# Patient Record
Sex: Female | Born: 1975 | Race: Black or African American | Hispanic: No | Smoking: Never smoker
Health system: Southern US, Community
[De-identification: ages and names within clinical notes are randomized; demographics above are authoritative.]

## PROBLEM LIST (undated history)

## (undated) DIAGNOSIS — R51 Headache: Secondary | ICD-10-CM

## (undated) DIAGNOSIS — D649 Anemia, unspecified: Secondary | ICD-10-CM

## (undated) DIAGNOSIS — Z87442 Personal history of urinary calculi: Secondary | ICD-10-CM

## (undated) DIAGNOSIS — R519 Headache, unspecified: Secondary | ICD-10-CM

## (undated) HISTORY — PX: DILATION AND CURETTAGE OF UTERUS: SHX78

## (undated) HISTORY — PX: WISDOM TOOTH EXTRACTION: SHX21

## (undated) HISTORY — PX: TUBAL LIGATION: SHX77

## (undated) HISTORY — PX: TOE SURGERY: SHX1073

---

## 2015-11-30 ENCOUNTER — Other Ambulatory Visit: Payer: Self-pay | Admitting: Physician Assistant

## 2015-11-30 DIAGNOSIS — M25571 Pain in right ankle and joints of right foot: Secondary | ICD-10-CM

## 2015-12-04 ENCOUNTER — Ambulatory Visit
Admission: RE | Admit: 2015-12-04 | Discharge: 2015-12-04 | Disposition: A | Discharge status: Home / Self Care | Payer: BLUE CROSS/BLUE SHIELD | Source: Ambulatory Visit | Attending: Physician Assistant | Admitting: Physician Assistant

## 2015-12-04 DIAGNOSIS — M25571 Pain in right ankle and joints of right foot: Secondary | ICD-10-CM

## 2016-09-25 ENCOUNTER — Emergency Department (HOSPITAL_COMMUNITY): Payer: BLUE CROSS/BLUE SHIELD

## 2016-09-25 ENCOUNTER — Emergency Department (HOSPITAL_COMMUNITY)
Admission: EM | Admit: 2016-09-25 | Discharge: 2016-09-25 | Disposition: A | Discharge status: Home / Self Care | Payer: BLUE CROSS/BLUE SHIELD | Attending: Emergency Medicine | Admitting: Emergency Medicine

## 2016-09-25 ENCOUNTER — Encounter (HOSPITAL_COMMUNITY): Payer: Self-pay | Admitting: *Deleted

## 2016-09-25 DIAGNOSIS — R109 Unspecified abdominal pain: Secondary | ICD-10-CM | POA: Diagnosis not present

## 2016-09-25 DIAGNOSIS — Z79899 Other long term (current) drug therapy: Secondary | ICD-10-CM | POA: Diagnosis not present

## 2016-09-25 LAB — CBC
HCT: 36.3 % (ref 36.0–46.0)
Hemoglobin: 11 g/dL — ABNORMAL LOW (ref 12.0–15.0)
MCH: 22.3 pg — ABNORMAL LOW (ref 26.0–34.0)
MCHC: 30.3 g/dL (ref 30.0–36.0)
MCV: 73.5 fL — ABNORMAL LOW (ref 78.0–100.0)
PLATELETS: 278 10*3/uL (ref 150–400)
RBC: 4.94 MIL/uL (ref 3.87–5.11)
RDW: 16.4 % — AB (ref 11.5–15.5)
WBC: 6.1 10*3/uL (ref 4.0–10.5)

## 2016-09-25 LAB — COMPREHENSIVE METABOLIC PANEL
ALT: 14 U/L (ref 14–54)
AST: 17 U/L (ref 15–41)
Albumin: 3.6 g/dL (ref 3.5–5.0)
Alkaline Phosphatase: 58 U/L (ref 38–126)
Anion gap: 8 (ref 5–15)
BILIRUBIN TOTAL: 0.6 mg/dL (ref 0.3–1.2)
BUN: 6 mg/dL (ref 6–20)
CHLORIDE: 106 mmol/L (ref 101–111)
CO2: 25 mmol/L (ref 22–32)
Calcium: 9.1 mg/dL (ref 8.9–10.3)
Creatinine, Ser: 0.78 mg/dL (ref 0.44–1.00)
Glucose, Bld: 103 mg/dL — ABNORMAL HIGH (ref 65–99)
POTASSIUM: 3.8 mmol/L (ref 3.5–5.1)
Sodium: 139 mmol/L (ref 135–145)
TOTAL PROTEIN: 7.2 g/dL (ref 6.5–8.1)

## 2016-09-25 LAB — URINALYSIS, ROUTINE W REFLEX MICROSCOPIC
Bilirubin Urine: NEGATIVE
Glucose, UA: NEGATIVE mg/dL
Hgb urine dipstick: NEGATIVE
KETONES UR: NEGATIVE mg/dL
LEUKOCYTES UA: NEGATIVE
NITRITE: NEGATIVE
PROTEIN: NEGATIVE mg/dL
Specific Gravity, Urine: 1.021 (ref 1.005–1.030)
pH: 5 (ref 5.0–8.0)

## 2016-09-25 LAB — POC URINE PREG, ED: Preg Test, Ur: NEGATIVE

## 2016-09-25 LAB — LIPASE, BLOOD: LIPASE: 13 U/L (ref 11–51)

## 2016-09-25 MED ORDER — MAGNESIUM CITRATE PO SOLN
1.0000 | Freq: Once | ORAL | Status: AC
  Administered 2016-09-25: 1 via ORAL
  Filled 2016-09-25: qty 296

## 2016-09-25 MED ORDER — KETOROLAC TROMETHAMINE 30 MG/ML IJ SOLN
30.0000 mg | Freq: Once | INTRAMUSCULAR | Status: AC
  Administered 2016-09-25: 30 mg via INTRAVENOUS
  Filled 2016-09-25: qty 1

## 2016-09-25 MED ORDER — MORPHINE SULFATE (PF) 4 MG/ML IV SOLN
4.0000 mg | Freq: Once | INTRAVENOUS | Status: AC | PRN
  Administered 2016-09-25: 4 mg via INTRAVENOUS
  Filled 2016-09-25: qty 1

## 2016-09-25 MED ORDER — SODIUM CHLORIDE 0.9 % IV BOLUS (SEPSIS)
1000.0000 mL | Freq: Once | INTRAVENOUS | Status: AC
  Administered 2016-09-25: 1000 mL via INTRAVENOUS

## 2016-09-25 NOTE — ED Notes (Signed)
Pt and family understood dc material. NAD noted 

## 2016-09-25 NOTE — ED Provider Notes (Signed)
[facility]-EMERGENCY DEPT Provider Note   CSN: [id number] Arrival date & time: 09/25/16  0151     History   Chief Complaint Chief Complaint  Patient presents with  . Abdominal Pain    HPI Lydia Cummings is a 41 y.o. female.  The history is provided by the patient and medical records. No language interpreter was used.  Abdominal Pain   Associated symptoms include nausea and constipation. Pertinent negatives include fever, diarrhea, vomiting, dysuria and headaches.   Lydia Cummings is a 41 y.o. female  with a PMH of kidney stones who presents to the Emergency Department complaining of left flank pain x 1 week. Pain is described as intermittent squeezing, burning pain. Initially pain was more towards the back, but now radiating more towards the lower abdomen. Associated symptoms include nausea. She was seen by urgent care a few days ago at which time she had blood in her urine and told this was likely 2/2 a kidney stone. She was given a shot of Toradol and rx for zofran. Told to take ibuprofen as needed but has provided little pain relief. Initially was vomiting but no emesis since given rx for zofran. Zofran has helped with nausea, but she believes this has now led to constipation. She did have a bowel movement this morning, but it was very hard and painful. No fever or chills. Pain today feels c/w previous kidney stone she had in the past.   Past Medical History:  Diagnosis Date  . Kidney stone     There are no active problems to display for this patient.   History reviewed. No pertinent surgical history.  OB History    No data available       Home Medications    Prior to Admission medications   Medication Sig Start Date End Date Taking? Authorizing Provider  butalbital-acetaminophen-caffeine (FIORICET, ESGIC) 50-325-40 MG tablet Take 1 tablet by mouth 2 (two) times daily as needed for headache.   Yes Historical Provider, MD  ondansetron (ZOFRAN-ODT) 4 MG disintegrating tablet  Take 4 mg by mouth every 6 (six) hours as needed. 09/19/16  Yes Historical Provider, MD    Family History No family history on file.  Social History Social History  Substance Use Topics  . Smoking status: Never Smoker  . Smokeless tobacco: Never Used  . Alcohol use No     Allergies   Septra [sulfamethoxazole-trimethoprim] and Latex   Review of Systems Review of Systems  Constitutional: Negative for chills and fever.  HENT: Negative for congestion.   Eyes: Negative for visual disturbance.  Respiratory: Negative for cough and shortness of breath.   Cardiovascular: Negative.   Gastrointestinal: Positive for abdominal pain, constipation and nausea. Negative for diarrhea and vomiting.  Genitourinary: Positive for flank pain. Negative for dysuria.  Musculoskeletal: Negative for back pain and neck pain.  Skin: Negative for rash.  Neurological: Negative for headaches.     Physical Exam Updated Vital Signs BP 121/81 (BP Location: Right Arm)   Pulse 65   Temp 97.7 F (36.5 C) (Oral)   Resp 18   Ht 5\' 7"  (1.702 m)   Wt 93.1 kg   LMP 09/08/2016   SpO2 100%   BMI 32.14 kg/m   Physical Exam  Constitutional: She is oriented to person, place, and time. She appears well-developed and well-nourished. No distress.  HENT:  Head: Normocephalic and atraumatic.  Cardiovascular: Normal rate, regular rhythm and normal heart sounds.   No murmur heard. Pulmonary/Chest: Effort normal and breath sounds  normal. No respiratory distress.  Abdominal: Soft. Bowel sounds are normal. She exhibits no distension.  TTP of left flank with no overlying skin changes.   Musculoskeletal: She exhibits no edema.  Neurological: She is alert and oriented to person, place, and time.  Skin: Skin is warm and dry.  Nursing note and vitals reviewed.    ED Treatments / Results  Labs (all labs ordered are listed, but only abnormal results are displayed) Labs Reviewed  COMPREHENSIVE METABOLIC PANEL -  Abnormal; Notable for the following:       Result Value   Glucose, Bld 103 (*)    All other components within normal limits  CBC - Abnormal; Notable for the following:    Hemoglobin 11.0 (*)    MCV 73.5 (*)    MCH 22.3 (*)    RDW 16.4 (*)    All other components within normal limits  URINALYSIS, ROUTINE W REFLEX MICROSCOPIC - Abnormal; Notable for the following:    APPearance HAZY (*)    All other components within normal limits  LIPASE, BLOOD  POC URINE PREG, ED    EKG  EKG Interpretation None       Radiology No results found.  Procedures Procedures (including critical care time)  Medications Ordered in ED Medications  magnesium citrate solution 1 Bottle (not administered)  sodium chloride 0.9 % bolus 1,000 mL (0 mLs Intravenous Stopped 09/25/16 0613)  ketorolac (TORADOL) 30 MG/ML injection 30 mg (30 mg Intravenous Given 09/25/16 0453)  morphine 4 MG/ML injection 4 mg (4 mg Intravenous Given 09/25/16 0453)     Initial Impression / Assessment and Plan / ED Course  I have reviewed the triage vital signs and the nursing notes.  Pertinent labs & imaging results that were available during my care of the patient were reviewed by me and considered in my medical decision making (see chart for details).    Lydia Cummings is a 41 y.o. female who presents to ED for persistent left flank pain x 1 week. Seen by urgent care at onset where blood was noted in urine and she was told she likely has a kidney stone. Hx of stones and this feels similar. No overlying skin changes. Labs reassuring. UA with no signs of infection or blood noted. Pain meds and fluids given. CT reviewed:   IMPRESSION: Punctate sized nonobstructing intrarenal stones bilaterally. No ureteral stone or obstruction. Small amount of free fluid in the pelvis is likely physiologic. Probable uterine fibroids. Subcentimeter cysts in the liver.  Discussed results with patient. Informed of probably fibroids and  encouraged to follow up with OBGYN. Possible she recently passed stone. Evaluation does not show pathology that would require ongoing emergent intervention or inpatient treatment. PCP follow up if symptoms persist. Patient re-evaluated and pain controlled. Return precautions discussed and all questions answered.    Final Clinical Impressions(s) / ED Diagnoses   Final diagnoses:  Left flank pain    New Prescriptions New Prescriptions   No medications on file     [facility], PA-C 09/25/16 0710    Dione Booze, MD 09/25/16 [phone]

## 2016-09-25 NOTE — ED Triage Notes (Signed)
Pt had n/v last week and [facility] and given zofran which she took and she has been constipated since.  Pt had 2 BM today but they were hard and caused her pain in her rectum (pt states that it was like passing concrete).  Pt also reports pain in left side which feels like a "rake" and pt worries that this may be a kidney stone.  No GU symptoms.  Pt continues to have n/v

## 2016-09-25 NOTE — Discharge Instructions (Signed)
Ibuprofen as needed for pain. Follow up with your OBGYN for possible fibroids.  Return to ER for new or worsening symptoms, any additional concerns.

## 2016-09-25 NOTE — ED Notes (Signed)
Patient transported to CT 

## 2016-10-10 ENCOUNTER — Other Ambulatory Visit: Payer: Self-pay | Admitting: Obstetrics and Gynecology

## 2016-10-12 ENCOUNTER — Other Ambulatory Visit: Payer: Self-pay | Admitting: Obstetrics and Gynecology

## 2016-11-17 ENCOUNTER — Encounter (HOSPITAL_COMMUNITY): Payer: Self-pay | Admitting: *Deleted

## 2016-11-20 ENCOUNTER — Other Ambulatory Visit: Payer: Self-pay | Admitting: Obstetrics and Gynecology

## 2016-11-21 ENCOUNTER — Encounter (HOSPITAL_COMMUNITY): Payer: Self-pay | Admitting: *Deleted

## 2016-11-21 ENCOUNTER — Ambulatory Visit (HOSPITAL_COMMUNITY)
Admission: RE | Admit: 2016-11-21 | Discharge: 2016-11-21 | Disposition: A | Discharge status: Home / Self Care | Payer: BLUE CROSS/BLUE SHIELD | Source: Ambulatory Visit | Attending: Obstetrics and Gynecology | Admitting: Obstetrics and Gynecology

## 2016-11-21 ENCOUNTER — Ambulatory Visit (HOSPITAL_COMMUNITY): Payer: BLUE CROSS/BLUE SHIELD | Admitting: Anesthesiology

## 2016-11-21 ENCOUNTER — Encounter (HOSPITAL_COMMUNITY)
Admission: RE | Disposition: A | Discharge status: Home / Self Care | Payer: Self-pay | Source: Ambulatory Visit | Attending: Obstetrics and Gynecology

## 2016-11-21 DIAGNOSIS — Z6830 Body mass index (BMI) 30.0-30.9, adult: Secondary | ICD-10-CM | POA: Insufficient documentation

## 2016-11-21 DIAGNOSIS — N858 Other specified noninflammatory disorders of uterus: Secondary | ICD-10-CM | POA: Diagnosis present

## 2016-11-21 DIAGNOSIS — N923 Ovulation bleeding: Secondary | ICD-10-CM | POA: Diagnosis not present

## 2016-11-21 DIAGNOSIS — N840 Polyp of corpus uteri: Secondary | ICD-10-CM | POA: Insufficient documentation

## 2016-11-21 DIAGNOSIS — E669 Obesity, unspecified: Secondary | ICD-10-CM | POA: Diagnosis not present

## 2016-11-21 HISTORY — DX: Headache: R51

## 2016-11-21 HISTORY — PX: DILATATION & CURETTAGE/HYSTEROSCOPY WITH MYOSURE: SHX6511

## 2016-11-21 HISTORY — DX: Headache, unspecified: R51.9

## 2016-11-21 HISTORY — DX: Personal history of urinary calculi: Z87.442

## 2016-11-21 HISTORY — DX: Anemia, unspecified: D64.9

## 2016-11-21 LAB — CBC
HEMATOCRIT: 36.1 % (ref 36.0–46.0)
HEMOGLOBIN: 11.1 g/dL — AB (ref 12.0–15.0)
MCH: 22.6 pg — AB (ref 26.0–34.0)
MCHC: 30.7 g/dL (ref 30.0–36.0)
MCV: 73.4 fL — AB (ref 78.0–100.0)
PLATELETS: 261 10*3/uL (ref 150–400)
RBC: 4.92 MIL/uL (ref 3.87–5.11)
RDW: 16.8 % — AB (ref 11.5–15.5)
WBC: 5.8 10*3/uL (ref 4.0–10.5)

## 2016-11-21 SURGERY — DILATATION & CURETTAGE/HYSTEROSCOPY WITH MYOSURE
Anesthesia: General | Site: Vagina

## 2016-11-21 MED ORDER — FENTANYL CITRATE (PF) 100 MCG/2ML IJ SOLN
25.0000 ug | INTRAMUSCULAR | Status: DC | PRN
  Administered 2016-11-21 (×3): 50 ug via INTRAVENOUS

## 2016-11-21 MED ORDER — KETOROLAC TROMETHAMINE 30 MG/ML IJ SOLN
INTRAMUSCULAR | Status: DC | PRN
  Administered 2016-11-21: 30 mg via INTRAVENOUS
  Administered 2016-11-21: 30 mg via INTRAMUSCULAR

## 2016-11-21 MED ORDER — MIDAZOLAM HCL 2 MG/2ML IJ SOLN
INTRAMUSCULAR | Status: AC
  Filled 2016-11-21: qty 2

## 2016-11-21 MED ORDER — FENTANYL CITRATE (PF) 250 MCG/5ML IJ SOLN
INTRAMUSCULAR | Status: AC
  Filled 2016-11-21: qty 5

## 2016-11-21 MED ORDER — LACTATED RINGERS IV SOLN
INTRAVENOUS | Status: DC
  Administered 2016-11-21: 14:00:00 via INTRAVENOUS

## 2016-11-21 MED ORDER — FENTANYL CITRATE (PF) 100 MCG/2ML IJ SOLN
INTRAMUSCULAR | Status: AC
  Administered 2016-11-21: 50 ug via INTRAVENOUS
  Filled 2016-11-21: qty 2

## 2016-11-21 MED ORDER — METOCLOPRAMIDE HCL 5 MG/ML IJ SOLN: 10.0000 mg | Freq: Once | INTRAMUSCULAR | Status: DC | PRN

## 2016-11-21 MED ORDER — KETOROLAC TROMETHAMINE 30 MG/ML IJ SOLN
INTRAMUSCULAR | Status: AC
  Filled 2016-11-21: qty 1

## 2016-11-21 MED ORDER — SODIUM CHLORIDE 0.9 % IR SOLN
Status: DC | PRN
  Administered 2016-11-21: 3000 mL

## 2016-11-21 MED ORDER — LIDOCAINE HCL (CARDIAC) 20 MG/ML IV SOLN
INTRAVENOUS | Status: AC
  Filled 2016-11-21: qty 5

## 2016-11-21 MED ORDER — DEXAMETHASONE SODIUM PHOSPHATE 10 MG/ML IJ SOLN
INTRAMUSCULAR | Status: DC | PRN
  Administered 2016-11-21: 10 mg via INTRAVENOUS

## 2016-11-21 MED ORDER — FENTANYL CITRATE (PF) 100 MCG/2ML IJ SOLN
INTRAMUSCULAR | Status: DC | PRN
  Administered 2016-11-21: 25 ug via INTRAVENOUS
  Administered 2016-11-21 (×2): 50 ug via INTRAVENOUS
  Administered 2016-11-21: 25 ug via INTRAVENOUS

## 2016-11-21 MED ORDER — PROPOFOL 10 MG/ML IV BOLUS
INTRAVENOUS | Status: AC
  Filled 2016-11-21: qty 20

## 2016-11-21 MED ORDER — DEXAMETHASONE SODIUM PHOSPHATE 10 MG/ML IJ SOLN
INTRAMUSCULAR | Status: AC
  Filled 2016-11-21: qty 1

## 2016-11-21 MED ORDER — ONDANSETRON HCL 4 MG/2ML IJ SOLN
INTRAMUSCULAR | Status: DC | PRN
  Administered 2016-11-21: 4 mg via INTRAVENOUS

## 2016-11-21 MED ORDER — HYDROCODONE-ACETAMINOPHEN 7.5-325 MG PO TABS
ORAL_TABLET | ORAL | Status: AC
  Filled 2016-11-21: qty 1

## 2016-11-21 MED ORDER — SCOPOLAMINE 1 MG/3DAYS TD PT72
1.0000 | MEDICATED_PATCH | Freq: Once | TRANSDERMAL | Status: DC
  Administered 2016-11-21: 1.5 mg via TRANSDERMAL

## 2016-11-21 MED ORDER — ONDANSETRON HCL 4 MG/2ML IJ SOLN
INTRAMUSCULAR | Status: AC
  Filled 2016-11-21: qty 2

## 2016-11-21 MED ORDER — SCOPOLAMINE 1 MG/3DAYS TD PT72
MEDICATED_PATCH | TRANSDERMAL | Status: AC
  Administered 2016-11-21: 1.5 mg via TRANSDERMAL
  Filled 2016-11-21: qty 1

## 2016-11-21 MED ORDER — MIDAZOLAM HCL 2 MG/2ML IJ SOLN
INTRAMUSCULAR | Status: DC | PRN
  Administered 2016-11-21 (×2): 1 mg via INTRAVENOUS

## 2016-11-21 MED ORDER — GLYCOPYRROLATE 0.2 MG/ML IJ SOLN
INTRAMUSCULAR | Status: AC
  Filled 2016-11-21: qty 1

## 2016-11-21 MED ORDER — PROPOFOL 10 MG/ML IV BOLUS
INTRAVENOUS | Status: DC | PRN
  Administered 2016-11-21: 200 mg via INTRAVENOUS

## 2016-11-21 MED ORDER — HYDROCODONE-ACETAMINOPHEN 7.5-325 MG PO TABS
1.0000 | ORAL_TABLET | Freq: Once | ORAL | Status: AC | PRN
  Administered 2016-11-21: 1 via ORAL

## 2016-11-21 MED ORDER — MEPERIDINE HCL 25 MG/ML IJ SOLN: 6.2500 mg | INTRAMUSCULAR | Status: DC | PRN

## 2016-11-21 MED ORDER — LIDOCAINE HCL (CARDIAC) 20 MG/ML IV SOLN
INTRAVENOUS | Status: DC | PRN
  Administered 2016-11-21: 100 mg via INTRAVENOUS

## 2016-11-21 SURGICAL SUPPLY — 20 items
CANISTER SUCT 3000ML PPV (MISCELLANEOUS) ×3 IMPLANT
CATH ROBINSON RED A/P 16FR (CATHETERS) ×3 IMPLANT
CLOTH BEACON ORANGE TIMEOUT ST (SAFETY) ×3 IMPLANT
CONTAINER PREFILL 10% NBF 60ML (FORM) ×6 IMPLANT
DEVICE MYOSURE LITE (MISCELLANEOUS) IMPLANT
DEVICE MYOSURE REACH (MISCELLANEOUS) ×3 IMPLANT
DILATOR CANAL MILEX (MISCELLANEOUS) ×3 IMPLANT
ELECT REM PT RETURN 9FT ADLT (ELECTROSURGICAL) ×3
ELECTRODE REM PT RTRN 9FT ADLT (ELECTROSURGICAL) ×1 IMPLANT
FILTER ARTHROSCOPY CONVERTOR (FILTER) ×3 IMPLANT
GLOVE BIOGEL PI IND STRL 7.0 (GLOVE) ×2 IMPLANT
GLOVE BIOGEL PI INDICATOR 7.0 (GLOVE) ×4
GLOVE ECLIPSE 6.5 STRL STRAW (GLOVE) ×3 IMPLANT
GOWN STRL REUS W/TWL LRG LVL3 (GOWN DISPOSABLE) ×6 IMPLANT
PACK VAGINAL MINOR WOMEN LF (CUSTOM PROCEDURE TRAY) ×3 IMPLANT
PAD OB MATERNITY 4.3X12.25 (PERSONAL CARE ITEMS) ×3 IMPLANT
SEAL ROD LENS SCOPE MYOSURE (ABLATOR) ×3 IMPLANT
TOWEL OR 17X24 6PK STRL BLUE (TOWEL DISPOSABLE) ×6 IMPLANT
TUBING AQUILEX INFLOW (TUBING) ×3 IMPLANT
TUBING AQUILEX OUTFLOW (TUBING) ×3 IMPLANT

## 2016-11-21 NOTE — Anesthesia Procedure Notes (Signed)
Procedure Name: LMA Insertion Date/Time: 11/21/2016 2:06 PM Performed by: Earmon Phoenix Pre-anesthesia Checklist: Patient identified, Emergency Drugs available, Suction available, Patient being monitored and Timeout performed Patient Re-evaluated:Patient Re-evaluated prior to inductionOxygen Delivery Method: Circle system utilized Preoxygenation: Pre-oxygenation with 100% oxygen Intubation Type: IV induction Ventilation: Mask ventilation without difficulty LMA Size: 4.0 Number of attempts: 1 Placement Confirmation: positive ETCO2,  CO2 detector and breath sounds checked- equal and bilateral Tube secured with: Tape

## 2016-11-21 NOTE — Transfer of Care (Signed)
Immediate Anesthesia Transfer of Care Note  Patient: Lydia Cummings  Procedure(s) Performed: Procedure(s): DILATATION & CURETTAGE/HYSTEROSCOPY WITH MYOSURE (N/A)  Patient Location: PACU  Anesthesia Type:General  Level of Consciousness: awake, alert , oriented and patient cooperative  Airway & Oxygen Therapy: Patient Spontanous Breathing and Patient connected to nasal cannula oxygen  Post-op Assessment: Report given to RN and Post -op Vital signs reviewed and stable  Post vital signs: Reviewed and stable  Last Vitals:  Vitals:   11/21/16 1258  BP: 123/83  Pulse: 75  Resp: 16  Temp: 36.8 C    Last Pain:  Vitals:   11/21/16 1258  TempSrc: Oral  PainSc: 3       Patients Stated Pain Goal: 2 (11/21/16 1258)  Complications: No apparent anesthesia complications

## 2016-11-21 NOTE — Brief Op Note (Signed)
11/21/2016  1:55 PM  PATIENT:  Lydia Cummings  41 y.o. female  PRE-OPERATIVE DIAGNOSIS:  Endometrial Polyp, IMB  POST-OPERATIVE DIAGNOSIS: same  PROCEDURE:  Diagnostic hysteroscopy, hysteroscopic resection of endometrial polyp using myosure, dilation and curettage  SURGEON:  Surgeon(s) and Role:    * Maxie Better, MD - Primary  PHYSICIAN ASSISTANT:   ASSISTANTS: none   ANESTHESIA:   general Findings: multiple large ant polyps, nl endocervical canal EBL:  No intake/output data recorded.  BLOOD ADMINISTERED:none  DRAINS: none   LOCAL MEDICATIONS USED:  NONE  SPECIMEN:  Source of Specimen:  EMC with polyp  DISPOSITION OF SPECIMEN:  PATHOLOGY  COUNTS:  YES  TOURNIQUET:  * No tourniquets in log *  DICTATION: .Other Dictation: Dictation Number [id number]  PLAN OF CARE: Discharge to home after PACU  PATIENT DISPOSITION:  PACU - hemodynamically stable.   Delay start of Pharmacological VTE agent (>24hrs) due to surgical blood loss or risk of bleeding: no

## 2016-11-21 NOTE — Anesthesia Postprocedure Evaluation (Signed)
Anesthesia Post Note  Patient: Jaedah Lords  Procedure(s) Performed: Procedure(s) (LRB): DILATATION & CURETTAGE/HYSTEROSCOPY WITH MYOSURE (N/A)  Patient location during evaluation: PACU Anesthesia Type: General Level of consciousness: awake and alert Pain management: pain level controlled Vital Signs Assessment: post-procedure vital signs reviewed and stable Respiratory status: spontaneous breathing, nonlabored ventilation and respiratory function stable Cardiovascular status: blood pressure returned to baseline and stable Postop Assessment: no signs of nausea or vomiting Anesthetic complications: no        Last Vitals:  Vitals:   11/21/16 1545 11/21/16 1600  BP: 119/86 118/87  Pulse: 62 (!) 59  Resp: 14 13  Temp:      Last Pain:  Vitals:   11/21/16 1258  TempSrc: Oral  PainSc: 3    Pain Goal: Patients Stated Pain Goal: 2 (11/21/16 1258)               Shreyan Hinz A.

## 2016-11-21 NOTE — H&P (Addendum)
Lydia Cummings is an 41 y.o. female. [id number] DBF presents for dx hysteroscopy, D&C, resection of endometrial mass/polyp noted on sonohysterogram  Pertinent Gynecological History: Menses: regular Bleeding: intermenstrual bleeding Contraception: tubal ligation DES exposure: denies Blood transfusions: none Sexually transmitted diseases: no past history Previous GYN Procedures: c/s  Last mammogram: normal Date: 09/2015 Last pap: normal Date: 02/2016 OB History: G3, P2   Menstrual History: Menarche age: n/a Patient's last menstrual period was 11/04/2016 (approximate).    Past Medical History:  Diagnosis Date  . Anemia   . Headache   . History of kidney stones    passed stone     Past Surgical History:  Procedure Laterality Date  . CESAREAN SECTION    . DILATION AND CURETTAGE OF UTERUS     MAB  . TOE SURGERY Right    right foot 4&5pinky toes  . TUBAL LIGATION    . WISDOM TOOTH EXTRACTION      History reviewed. No pertinent family history.  Social History:  reports that she has never smoked. She has never used smokeless tobacco. She reports that she drinks alcohol. She reports that she does not use drugs.  Allergies:  Allergies  Allergen Reactions  . Iron Hives and Other (See Comments)    "Bone inflammation"  . Septra [Sulfamethoxazole-Trimethoprim] Hives and Nausea And Vomiting  . Latex Rash  . Nickel Hives and Other (See Comments)    "Bone inflammation"    No prescriptions prior to admission.    Review of Systems  All other systems reviewed and are negative.   Height  (1.702 m), weight 88.5 kg (195 lb), last menstrual period 11/04/2016. Physical Exam  Constitutional: She is oriented to person, place, and time. She appears well-developed and well-nourished.  HENT:  Head: Atraumatic.  Eyes: EOM are normal.  Cardiovascular: Regular rhythm.   GI: Bowel sounds are normal.  Genitourinary: Vagina normal and uterus normal.  Genitourinary Comments: Adnexa  nl Cervix closed Vulva nl  Musculoskeletal: Normal range of motion.  Neurological: She is alert and oriented to person, place, and time. She has normal reflexes.  Skin: Skin is warm and dry.  Psychiatric: She has a normal mood and affect.    No results found for this or any previous visit (from the past 24 hour(s)).  No results found.  Assessment/Plan: Endometrial mass Fibroid uterus P) dx hysteroscopy, d&c, resection of endometrial mass. Risk of surgery includes infection, bleeding, injury To surrounding organ structures, thermal injury, uterine perforation and its risk All ? answered Lydia Cummings A 11/21/2016,

## 2016-11-21 NOTE — Discharge Instructions (Signed)
DISCHARGE INSTRUCTIONS: D&C / D&E °The following instructions have been prepared to help you care for yourself upon your return home. °  °Personal hygiene: °• Use sanitary pads for vaginal drainage, not tampons. °• Shower the day after your procedure. °• NO tub baths, pools or Jacuzzis for 2-3 weeks. °• Wipe front to back after using the bathroom. ° °Activity and limitations: °• Do NOT drive or operate any equipment for 24 hours. The effects of anesthesia are still present and drowsiness may result. °• Do NOT rest in bed all day. °• Walking is encouraged. °• Walk up and down stairs slowly. °• You may resume your normal activity in one to two days or as indicated by your physician. ° °Sexual activity: NO intercourse for at least 2 weeks after the procedure, or as indicated by your physician. ° °Diet: Eat a light meal as desired this evening. You may resume your usual diet tomorrow. ° °Return to work: You may resume your work activities in one to two days or as indicated by your doctor. ° °What to expect after your surgery: Expect to have vaginal bleeding/discharge for 2-3 days and spotting for up to 10 days. It is not unusual to have soreness for up to 1-2 weeks. You may have a slight burning sensation when you urinate for the first day. Mild cramps may continue for a couple of days. You may have a regular period in 2-6 weeks. ° °Call your doctor for any of the following: °• Excessive vaginal bleeding, saturating and changing one pad every hour. °• Inability to urinate 6 hours after discharge from hospital. °• Pain not relieved by pain medication. °• Fever of 100.4° F or greater. °• Unusual vaginal discharge or odor. ° ° Call for an appointment:  ° ° °Patient’s signature: ______________________ ° °Nurse’s signature ________________________ ° °Support person's signature_______________________ ° ° °CALL  IF TEMP>100.4, NOTHING PER VAGINA X 1 WK, CALL IF SOAKING A MAXI  PAD EVERY HOUR OR MORE FREQUENTLY °

## 2016-11-21 NOTE — Anesthesia Preprocedure Evaluation (Signed)
Anesthesia Evaluation  Patient identified by MRN, date of birth, ID band Patient awake    Reviewed: Allergy & Precautions, NPO status , Patient's Chart, lab work & pertinent test results  Airway Mallampati: II  TM Distance: >3 FB Neck ROM: Full    Dental no notable dental hx. (+) Teeth Intact   Pulmonary neg pulmonary ROS,    Pulmonary exam normal breath sounds clear to auscultation       Cardiovascular negative cardio ROS Normal cardiovascular exam Rhythm:Regular Rate:Normal     Neuro/Psych  Headaches, negative psych ROS   GI/Hepatic negative GI ROS, Neg liver ROS,   Endo/Other  Obesity  Renal/GU Hx/o renal calculi  negative genitourinary   Musculoskeletal negative musculoskeletal ROS (+)   Abdominal (+) + obese,   Peds  Hematology  (+) anemia ,   Anesthesia Other Findings   Reproductive/Obstetrics Endometrial polyp                             Anesthesia Physical Anesthesia Plan  ASA: II  Anesthesia Plan: General   Post-op Pain Management:    Induction: Intravenous  Airway Management Planned: LMA  Additional Equipment:   Intra-op Plan:   Post-operative Plan: Extubation in OR  Informed Consent: I have reviewed the patients History and Physical, chart, labs and discussed the procedure including the risks, benefits and alternatives for the proposed anesthesia with the patient or authorized representative who has indicated his/her understanding and acceptance.   Dental advisory given  Plan Discussed with: CRNA, Anesthesiologist and Surgeon  Anesthesia Plan Comments:         Anesthesia Quick Evaluation

## 2016-11-22 ENCOUNTER — Encounter (HOSPITAL_COMMUNITY): Payer: Self-pay | Admitting: Obstetrics and Gynecology

## 2016-11-22 NOTE — Op Note (Signed)
NAME:  Lydia Cummings, ALFONZO                  ACCOUNT NO.:  MEDICAL RECORD NO.:  [id number]  LOCATION:                                 FACILITY:  PHYSICIAN:  Maxie Better, M.D.DATE OF BIRTH:  [birth year]  DATE OF PROCEDURE:  11/21/2016 DATE OF DISCHARGE:                              OPERATIVE REPORT   PREOPERATIVE DIAGNOSES: 1. Endometrial polyp. 2. Intermenstrual bleeding.  PROCEDURE: 1. Diagnostic hysteroscopy. 2. Hysteroscopic resection of endometrial polyp. 3. Dilatation and curettage.  POSTOPERATIVE DIAGNOSES: 1. Endometrial polyp. 2. Intermenstrual bleeding.  ANESTHESIA:  General.  SURGEON:  Maxie Better, M.D.  ASSISTANT:  None.  DESCRIPTION OF PROCEDURE:  Under adequate general anesthesia, the patient was placed in a dorsal lithotomy position.  She was sterilely prepped and draped in the usual fashion.  Bladder catheterized for moderate amount of urine.  Examination under anesthesia revealed an anteflexed slightly enlarged fibrotic uterus.  No adnexal masses could be appreciated.  Bivalve speculum was placed in the vagina.  Single- tooth tenaculum was placed on the anterior lip of the cervix.  The cervix was ultimately dilated up to #25 dilator in order to accept the hysteroscope which was once inserted showed anterior multiple enlarged polypoid lesions.  Using the [facility] MyoSure resectoscope, the polypoid lesions were removed.  Tubal ostia could both be seen.  The endocervical canal was inspected.  No other lesions were noted.  At that point, the instrument was removed.  The cavity was curetted for moderate amount of tissue.  All instruments were then removed from the vagina.  SPECIMEN:  Labeled endometrial curetting with endometrial polyp was sent to Pathology.  ESTIMATED BLOOD LOSS:  5 mL.  COMPLICATIONS:  None.  The patient tolerated the procedure well, was transferred to recovery in stable condition.     Maxie Better,  M.D.     [place]/[facility]  D:  11/21/2016  T:  11/22/2016  Job:  [id number]

## 2017-10-25 IMAGING — CT CT RENAL STONE PROTOCOL
2 of 4 series · 12 of 46 positions shown, 14 images · non-contrast
Comparison: None.

CLINICAL DATA: Left flank pain. History of kidney stones. Nausea
and vomiting this week. Constipation.

EXAM:
CT ABDOMEN AND PELVIS WITHOUT CONTRAST
TECHNIQUE: Multidetector CT imaging of the abdomen and pelvis was performed
following the standard protocol without IV contrast.

[Series 201: stone study, idose (2) · axial · 0.68mm/px · z∈[-393,-28]mm · 9 of 87 slices shown, 11 images]
[im 7/87  soft-tissue]
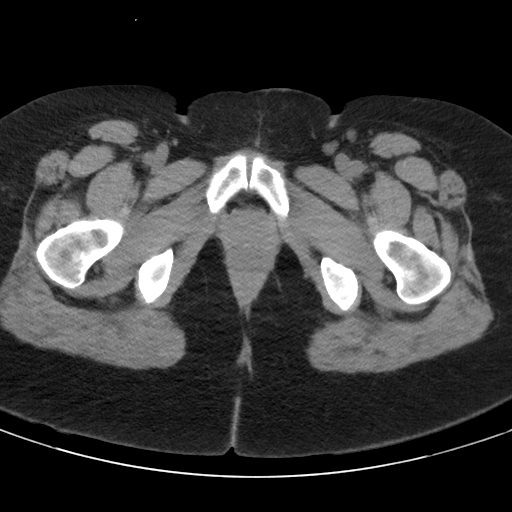
[im 7/87  bone]
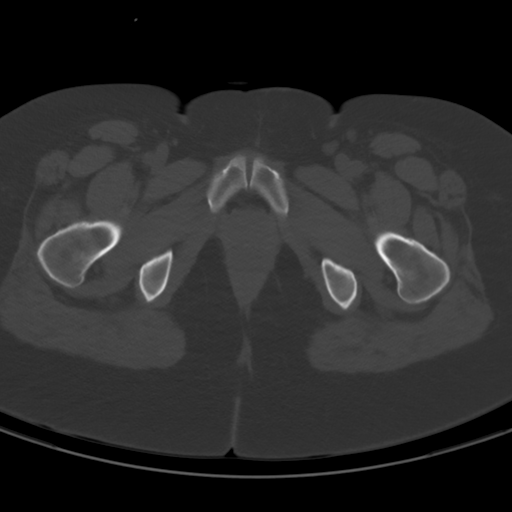
[im 17/87  soft-tissue]
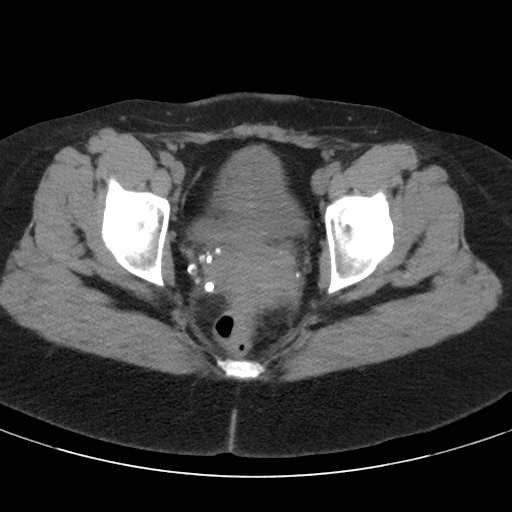
[im 24/87  soft-tissue]
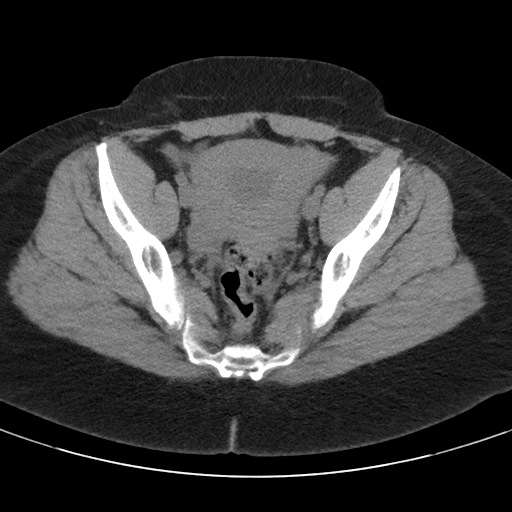
[im 34/87  soft-tissue]
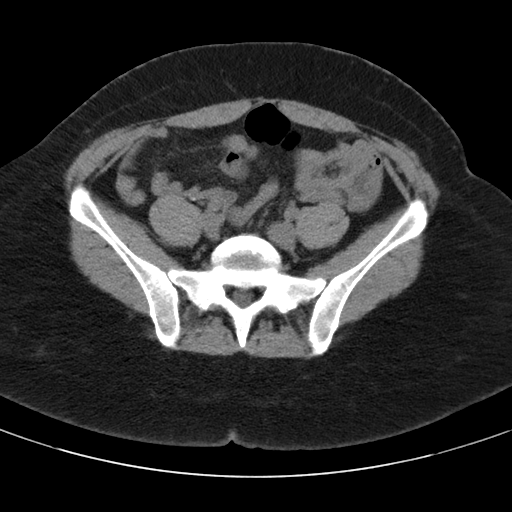
[im 44/87  soft-tissue]
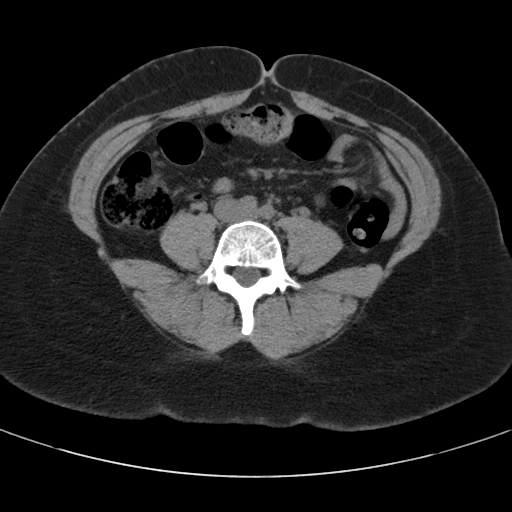
[im 53/87  soft-tissue]
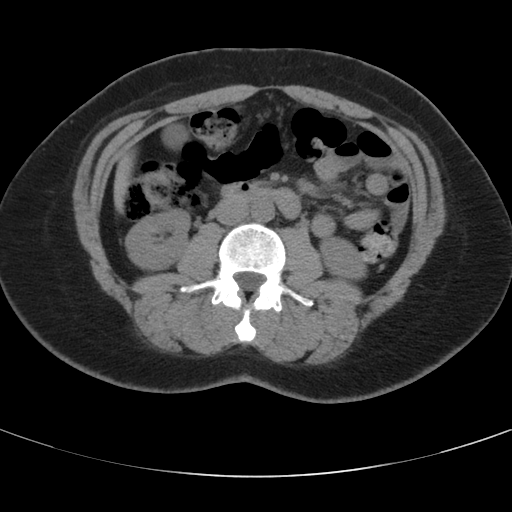
[im 63/87  soft-tissue]
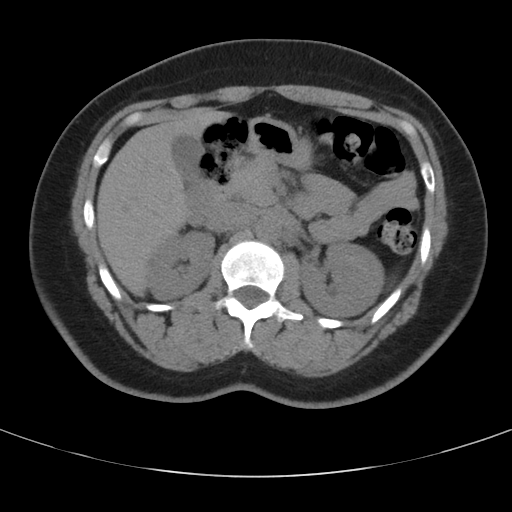
[im 70/87  soft-tissue]
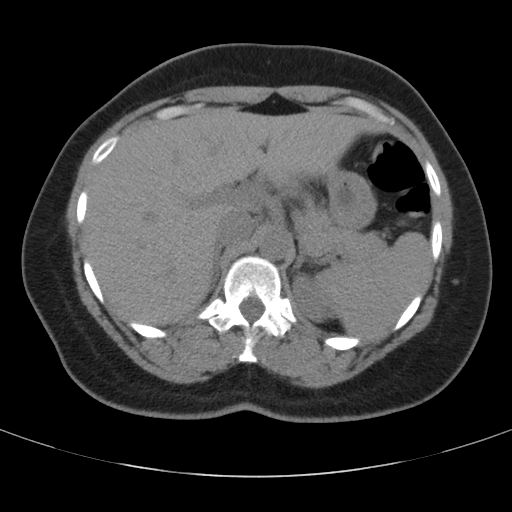
[im 80/87  soft-tissue]
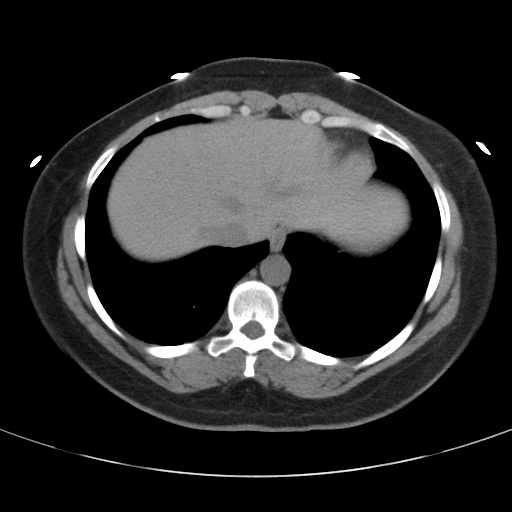
[im 80/87  bone]
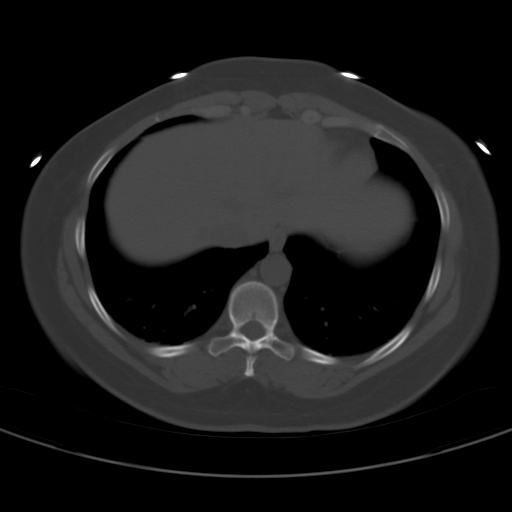

[Series 203: coronals, idose (2) · coronal · 0.45mm/px · 3 of 119 slices shown]
[im 40/119  soft-tissue]
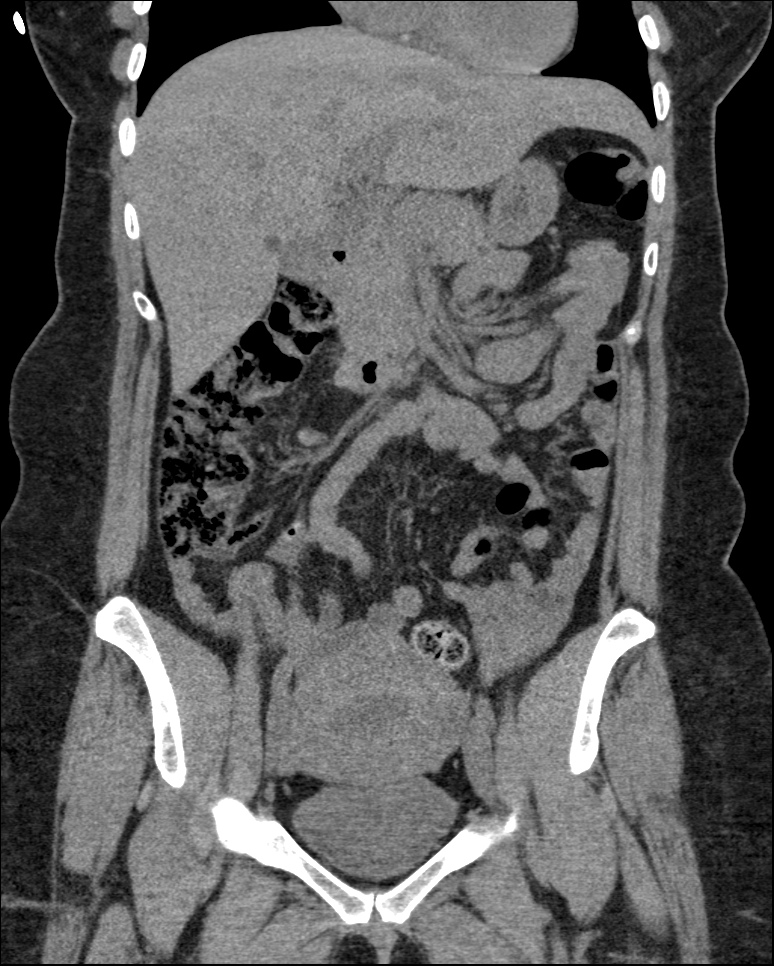
[im 53/119  soft-tissue]
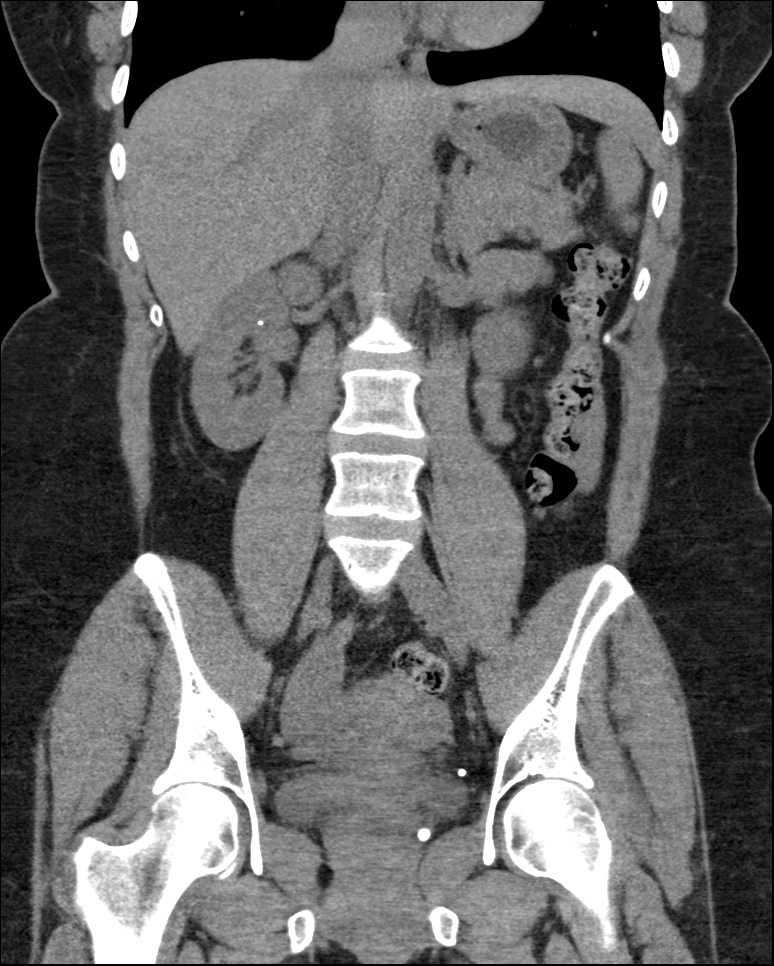
[im 66/119  soft-tissue]
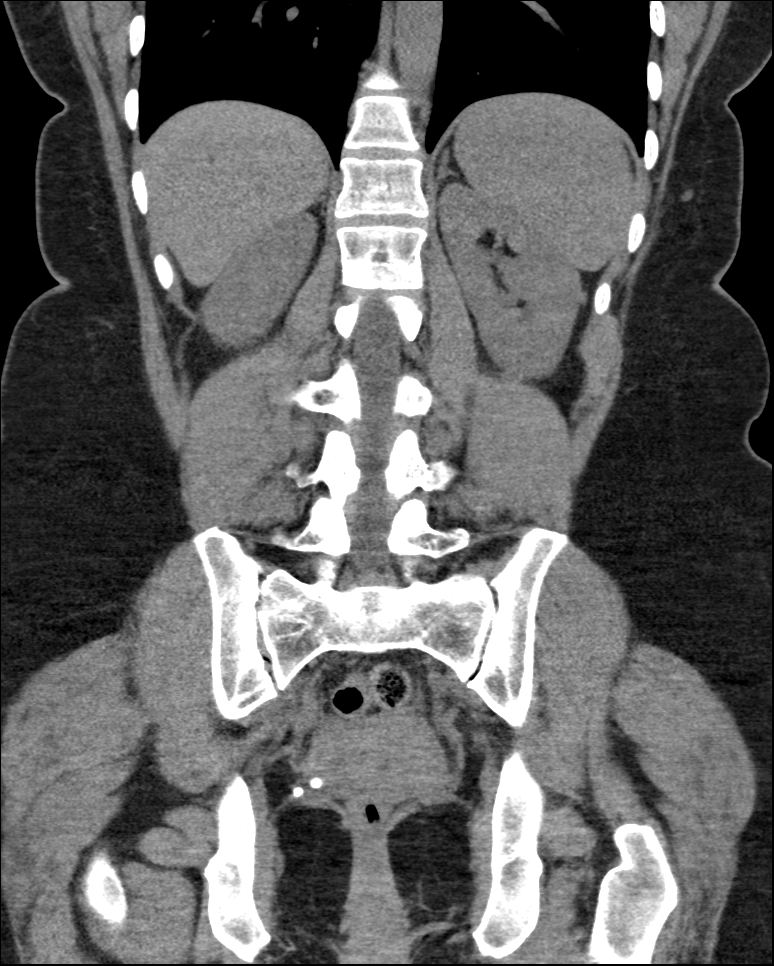

[12 of 46 positions shown; findings below may reference images not displayed]

FINDINGS: Lower chest: The lung bases are clear.

Hepatobiliary: Subcentimeter low-attenuation lesions are
demonstrated scattered within the liver. These are too small to
characterize but likely represent cysts. Gallbladder and bile ducts
are unremarkable.

Pancreas: Unremarkable. No pancreatic ductal dilatation or
surrounding inflammatory changes.

Spleen: Normal in size without focal abnormality.

Adrenals/Urinary Tract: No adrenal gland nodules. Scattered punctate
size stones in both kidneys. No hydronephrosis or hydroureter. No
ureteral stones. No bladder stones or bladder wall thickening.

Stomach/Bowel: Stomach and small bowel are decompressed. Scattered
gas and stool in the colon. No wall thickening or inflammation.
Appendix is normal.

Vascular/Lymphatic: No significant vascular findings are present. No
enlarged abdominal or pelvic lymph nodes.

Reproductive: Uterus is anteverted. Posterior uterus is somewhat
nodular suggesting fibroids. Ovaries are not enlarged. Small amount
of free fluid in the pelvis is likely to be physiologic.

Other: Abdominal wall musculature appears intact. No free air or
free fluid in the abdomen.

Musculoskeletal: No acute or significant osseous findings.
IMPRESSION: Punctate sized nonobstructing intrarenal stones bilaterally. No
ureteral stone or obstruction. Small amount of free fluid in the
pelvis is likely physiologic. Probable uterine fibroids.
Subcentimeter cysts in the liver.
